# Patient Record
Sex: Female | Born: 1970 | Race: White | Hispanic: No | Marital: Married
Health system: Southern US, Community
[De-identification: ages and names within clinical notes are randomized; demographics above are authoritative.]

---

## 2016-06-15 ENCOUNTER — Other Ambulatory Visit: Payer: Self-pay | Admitting: Family Medicine

## 2016-06-15 ENCOUNTER — Ambulatory Visit
Admission: RE | Admit: 2016-06-15 | Discharge: 2016-06-15 | Disposition: A | Discharge status: Home / Self Care | Payer: Self-pay | Source: Ambulatory Visit | Attending: Family Medicine | Admitting: Family Medicine

## 2016-06-15 DIAGNOSIS — R05 Cough: Secondary | ICD-10-CM

## 2016-06-15 DIAGNOSIS — R059 Cough, unspecified: Secondary | ICD-10-CM

## 2016-11-29 ENCOUNTER — Telehealth: Payer: Self-pay | Admitting: Acute Care

## 2016-12-03 NOTE — Telephone Encounter (Signed)
Patient called requesting to schedule appointment for CT scan of lungs.

## 2016-12-07 NOTE — Telephone Encounter (Signed)
Will route to the lung cancer screening pool 

## 2016-12-12 NOTE — Telephone Encounter (Signed)
[facility]:30 with SG Pt aware that someone will be calling her to schedule her CT scan.   After reviewing information prior to scheduling the CT scan the patient does not qualify per insurance d/t to her age. The cut off is 48 years of age and she is 46 years old.   Will hold in my box to call patient back to explain to her why she does not qualify. The appt will then have to be cancelled.

## 2016-12-14 NOTE — Telephone Encounter (Signed)
Spoke with patient and explained the Medicare/Private Insurance guidelines for the Lung Cancer Screening Program.  Pt aware that until she is 46 years old we cannot follow her under our screening program d/t to those guidelines. Pt is aware that she can contact her PCP and have them order the LDCT Screening and result them for an out of pocket cost of $299.00. Pt will have to be followed by her PCP until she reaches 48 years of age. Pt expressed understanding and will contact her PCP.  Nothing further needed.

## 2016-12-21 ENCOUNTER — Encounter: Payer: Self-pay | Admitting: Acute Care

## 2021-11-01 ENCOUNTER — Other Ambulatory Visit: Payer: Self-pay | Admitting: Family Medicine

## 2021-11-01 DIAGNOSIS — Z87891 Personal history of nicotine dependence: Secondary | ICD-10-CM

## 2021-12-01 ENCOUNTER — Ambulatory Visit: Payer: Self-pay

## 2021-12-15 ENCOUNTER — Ambulatory Visit: Payer: Self-pay

## 2022-01-05 ENCOUNTER — Ambulatory Visit
Admission: RE | Admit: 2022-01-05 | Discharge: 2022-01-05 | Disposition: A | Discharge status: Home / Self Care | Payer: 59 | Source: Ambulatory Visit | Attending: Family Medicine | Admitting: Family Medicine

## 2022-01-05 DIAGNOSIS — Z87891 Personal history of nicotine dependence: Secondary | ICD-10-CM

## 2022-12-20 IMAGING — CT CT CHEST LUNG CANCER SCREENING LOW DOSE W/O CM
2 of 5 series · 15 of 40 positions shown, 18 images · non-contrast
Comparison: None.

CLINICAL DATA: Lung cancer screening. Former asymptomatic smoker.
Twenty pack-year history.

EXAM:
CT CHEST WITHOUT CONTRAST LOW-DOSE FOR LUNG CANCER SCREENING
TECHNIQUE: Multidetector CT imaging of the chest was performed following the
standard protocol without IV contrast.

[Series 4: lung 1.00 br44 cor · coronal · 0.57mm/px · 3 of 275 slices shown]
[im 55/275  lung]
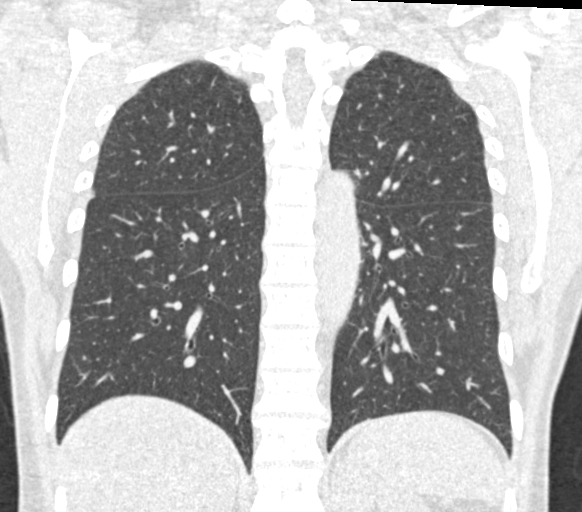
[im 110/275  lung]
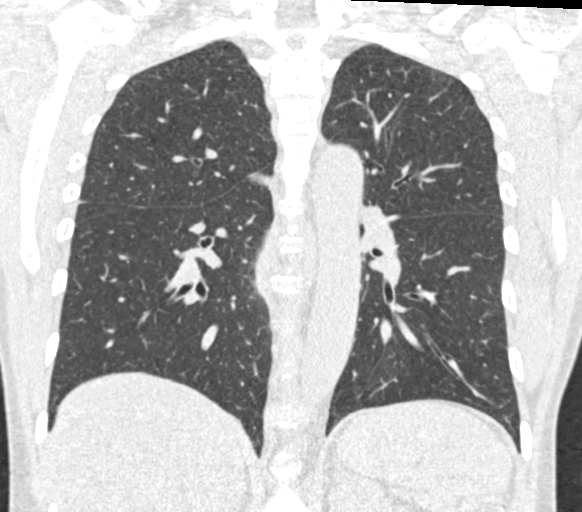
[im 165/275  lung]
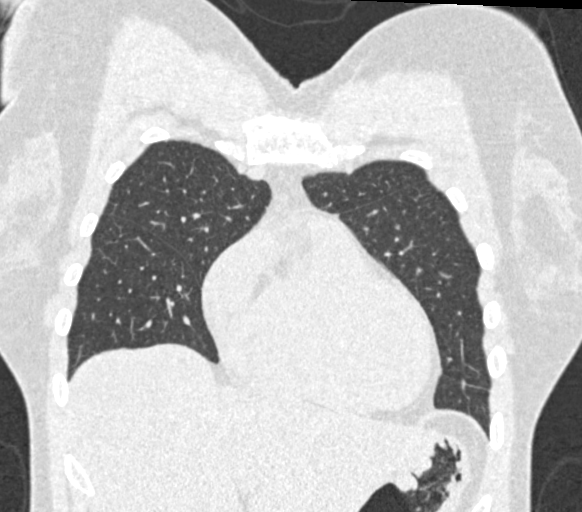

[Series 9: lung 1.00 br60 axial · axial · 0.65mm/px · z∈[-1217,-963]mm · 12 of 280 slices shown, 15 images]
[im 13/280  mediastinal]
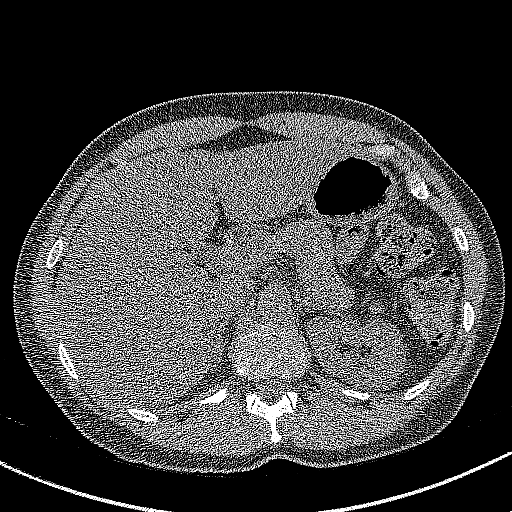
[im 13/280  lung]
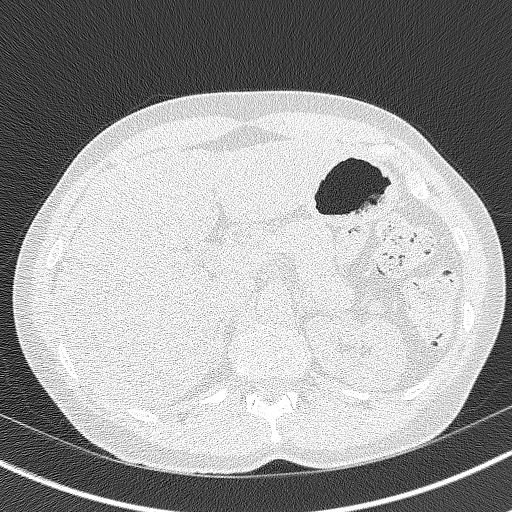
[im 39/280  lung]
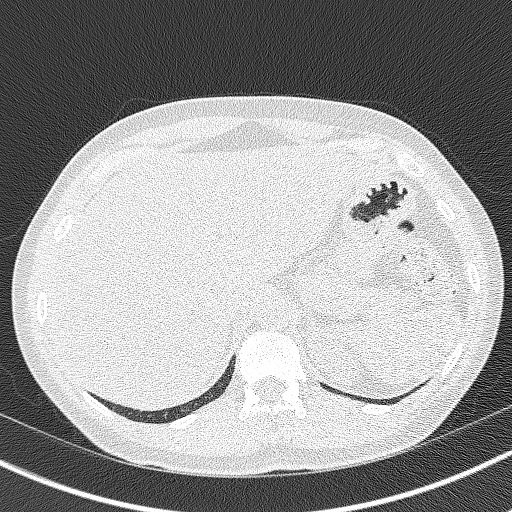
[im 64/280  lung]
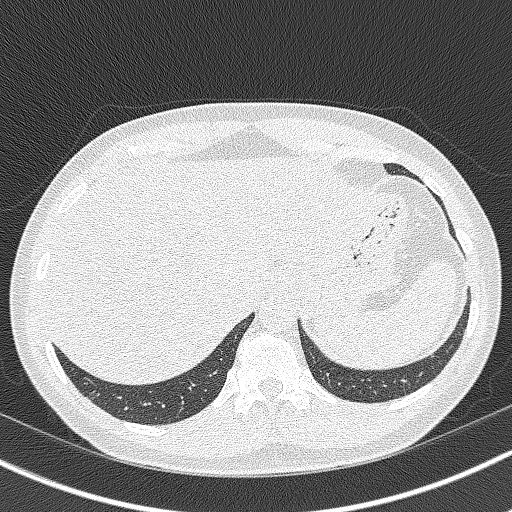
[im 89/280  lung]
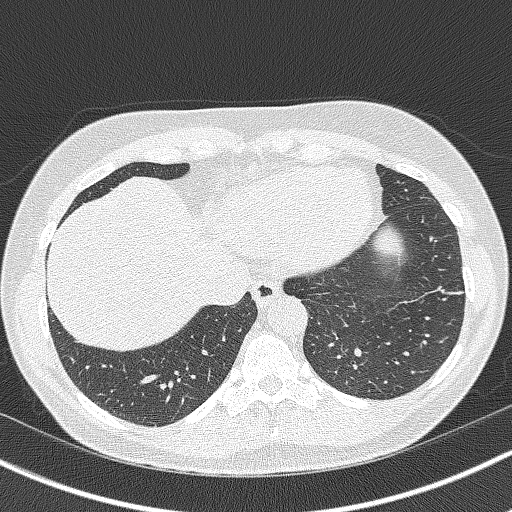
[im 102/280  mediastinal]
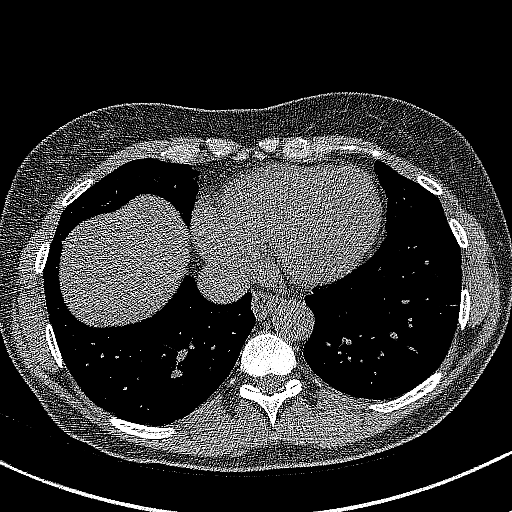
[im 102/280  lung]
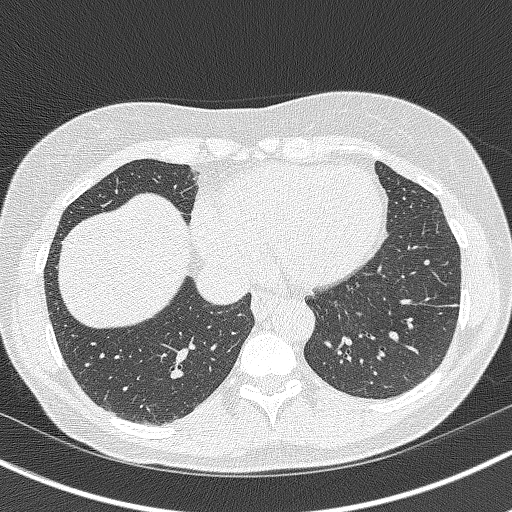
[im 127/280  lung]
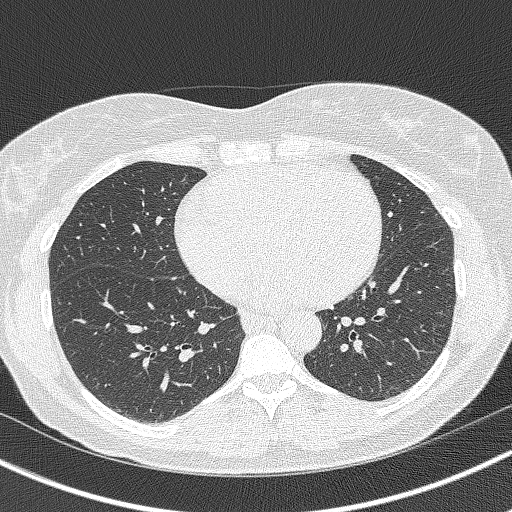
[im 153/280  lung]
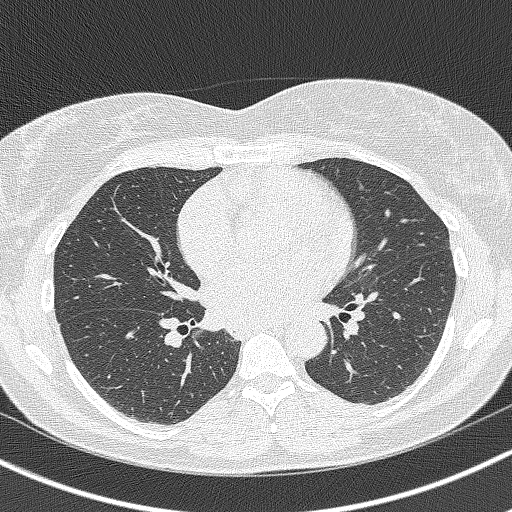
[im 178/280  lung]
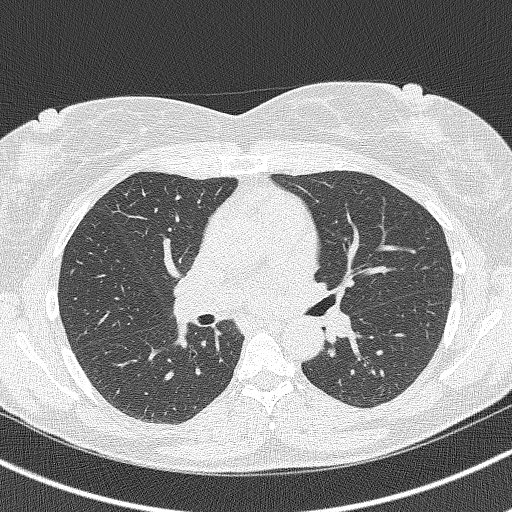
[im 191/280  mediastinal]
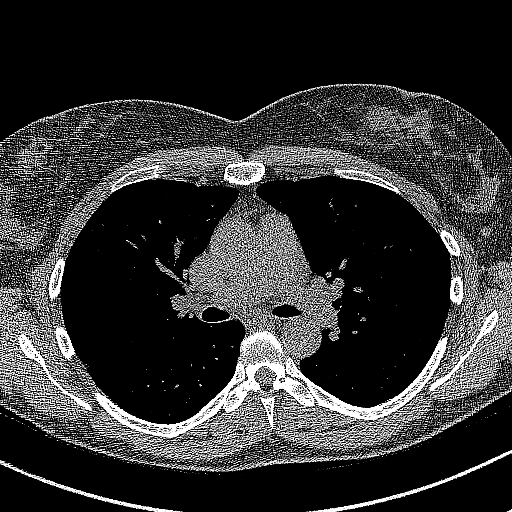
[im 191/280  lung]
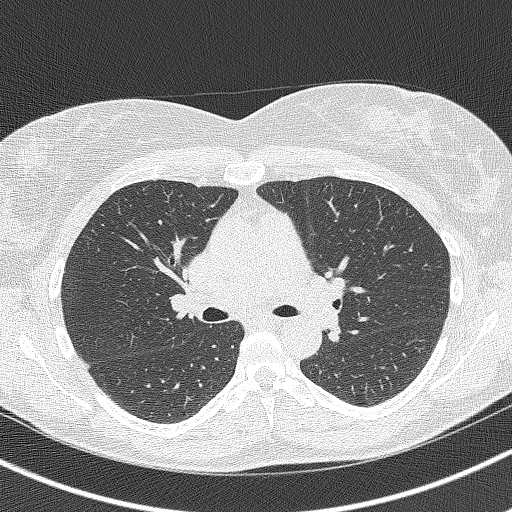
[im 216/280  lung]
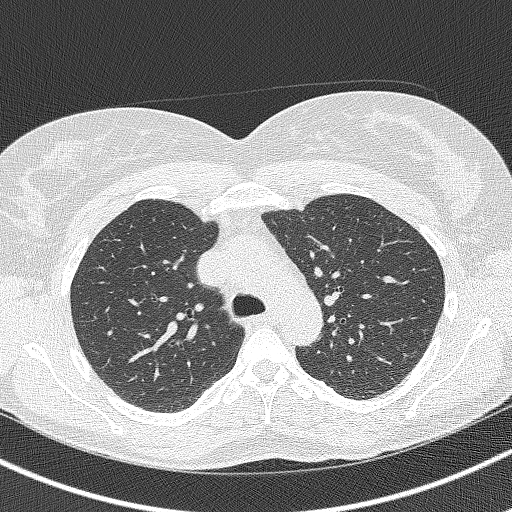
[im 241/280  lung]
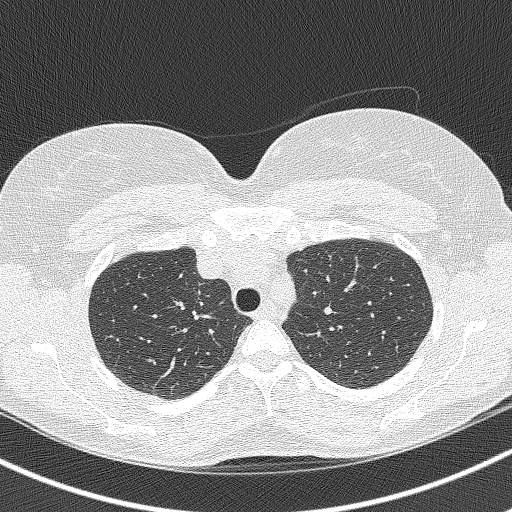
[im 267/280  lung]
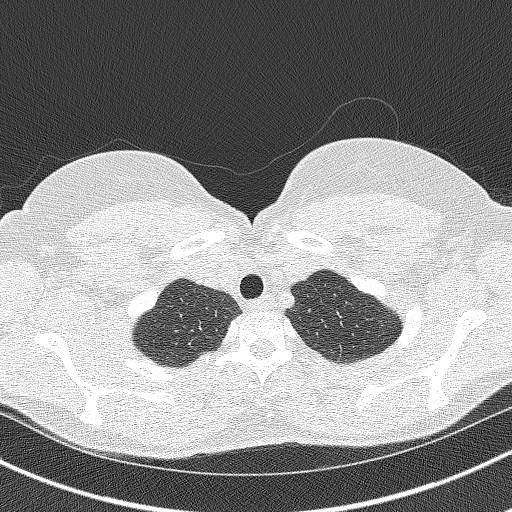

[15 of 40 positions shown; findings below may reference images not displayed]

FINDINGS: Cardiovascular: Heart size normal.  No pericardial effusion.

Mediastinum/Nodes: No enlarged mediastinal, hilar, or axillary lymph
nodes. Thyroid gland, trachea, and esophagus demonstrate no
significant findings.

Lungs/Pleura: Mild centrilobular and paraseptal emphysema. No signs
of pleural effusion, airspace consolidation, or pneumothorax. Scar
like density noted within the lateral left lung base. No suspicious
lung nodules identified.

Upper Abdomen: No acute abnormality.

Musculoskeletal: No chest wall mass or suspicious bone lesions
identified.
IMPRESSION: 1. Lung-RADS 1, negative. Continue annual screening with low-dose
chest CT without contrast in 12 months.
2.  Emphysema ([id number]).

## 2023-01-15 ENCOUNTER — Other Ambulatory Visit: Payer: Self-pay | Admitting: Family Medicine

## 2023-01-15 DIAGNOSIS — Z87891 Personal history of nicotine dependence: Secondary | ICD-10-CM

## 2023-01-15 DIAGNOSIS — J439 Emphysema, unspecified: Secondary | ICD-10-CM

## 2023-02-15 ENCOUNTER — Ambulatory Visit
Admission: RE | Admit: 2023-02-15 | Discharge: 2023-02-15 | Disposition: A | Discharge status: Home / Self Care | Payer: Commercial Managed Care - PPO | Source: Ambulatory Visit | Attending: Family Medicine | Admitting: Family Medicine

## 2023-02-15 DIAGNOSIS — J439 Emphysema, unspecified: Secondary | ICD-10-CM

## 2023-02-15 DIAGNOSIS — Z87891 Personal history of nicotine dependence: Secondary | ICD-10-CM

## 2024-01-28 ENCOUNTER — Other Ambulatory Visit: Payer: Self-pay | Admitting: Family Medicine

## 2024-01-28 DIAGNOSIS — J430 Unilateral pulmonary emphysema [MacLeod's syndrome]: Secondary | ICD-10-CM

## 2024-01-28 DIAGNOSIS — Z87891 Personal history of nicotine dependence: Secondary | ICD-10-CM

## 2024-02-17 ENCOUNTER — Ambulatory Visit
Admission: RE | Admit: 2024-02-17 | Discharge: 2024-02-17 | Disposition: A | Discharge status: Home / Self Care | Payer: Commercial Managed Care - PPO | Source: Ambulatory Visit | Attending: Family Medicine | Admitting: Family Medicine

## 2024-02-17 DIAGNOSIS — Z87891 Personal history of nicotine dependence: Secondary | ICD-10-CM

## 2024-02-17 DIAGNOSIS — J430 Unilateral pulmonary emphysema [MacLeod's syndrome]: Secondary | ICD-10-CM
